# Patient Record
Sex: Female | Born: 1965 | Marital: Married
Health system: Southern US, Community
[De-identification: ages and names within clinical notes are randomized; demographics above are authoritative.]

---

## 2006-07-13 ENCOUNTER — Ambulatory Visit: Payer: Self-pay | Admitting: Obstetrics and Gynecology

## 2006-08-06 ENCOUNTER — Ambulatory Visit: Payer: Self-pay | Admitting: Unknown Physician Specialty

## 2007-10-05 ENCOUNTER — Ambulatory Visit: Payer: Self-pay | Admitting: Obstetrics and Gynecology

## 2009-03-11 ENCOUNTER — Ambulatory Visit: Payer: Self-pay | Admitting: Obstetrics and Gynecology

## 2009-03-14 ENCOUNTER — Ambulatory Visit: Payer: Self-pay | Admitting: Obstetrics and Gynecology

## 2009-09-17 ENCOUNTER — Ambulatory Visit: Payer: Self-pay | Admitting: Obstetrics and Gynecology

## 2010-03-18 ENCOUNTER — Ambulatory Visit: Payer: Self-pay | Admitting: Obstetrics and Gynecology

## 2011-06-23 ENCOUNTER — Ambulatory Visit: Payer: Self-pay | Admitting: Obstetrics and Gynecology

## 2012-06-30 ENCOUNTER — Ambulatory Visit: Payer: Self-pay | Admitting: Obstetrics and Gynecology

## 2013-06-27 ENCOUNTER — Ambulatory Visit: Payer: Self-pay | Admitting: Obstetrics and Gynecology

## 2015-04-21 IMAGING — US US BREAST*R* LIMITED INC AXILLA
1 series · 1 of 1 positions shown · non-contrast
Comparison: All prior studies dating back to 4118

CLINICAL DATA: Referring physician palpates a thickening in the 6
o'clock position of the right breast 2 cm from the nipple as
indicated by the patient

EXAM:
DIGITAL DIAGNOSTIC  BILATERAL MAMMOGRAM WITH CAD
ULTRASOUND RIGHT BREAST

[Series 1: us breast*right* limited inc axilla · 0.08mm/px · 1 of 1 slices shown]
[im 1/1]
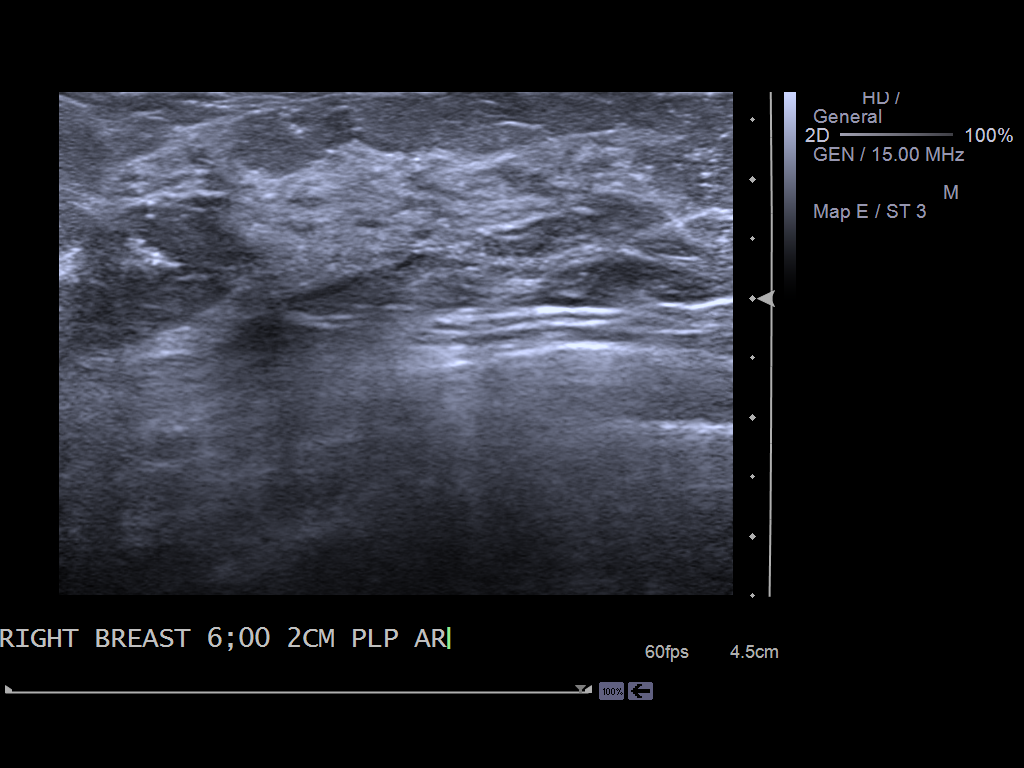

[1 of 1 positions shown; findings below may reference images not displayed]

ACR Breast Density Category c: The breast tissue is heterogeneously
dense, which may obscure small masses.
FINDINGS: Both breasts are unchanged and mammographically negative, including
on bilateral spot compression views. There is no evidence of mass or
distortion in the area of concern.

Mammographic images were processed with CAD.

On physical exam, there is palpable nodular breast tissue 2 cm from
the nipple in the 6 o'clock position of the right breast.

Ultrasound is performed, showing normal dense glandular tissue
corresponding to the area of palpable concern. There is no evidence
of mass.
IMPRESSION: No suspicious findings

RECOMMENDATION:
Screening bilateral mammogram in 1 year

I have discussed the findings and recommendations with the patient.
Results were also provided in writing at the conclusion of the
visit. If applicable, a reminder letter will be sent to the patient
regarding the next appointment.

BI-RADS CATEGORY  1: Negative.

## 2019-01-02 ENCOUNTER — Other Ambulatory Visit: Payer: Self-pay

## 2019-01-02 DIAGNOSIS — Z20822 Contact with and (suspected) exposure to covid-19: Secondary | ICD-10-CM

## 2019-01-03 LAB — NOVEL CORONAVIRUS, NAA: SARS-CoV-2, NAA: NOT DETECTED

## 2019-05-13 ENCOUNTER — Ambulatory Visit: Payer: Self-pay | Attending: Internal Medicine

## 2019-05-13 DIAGNOSIS — Z23 Encounter for immunization: Secondary | ICD-10-CM

## 2019-05-13 NOTE — Progress Notes (Signed)
   Covid-19 Vaccination Clinic  Name:  Nicole Hancock    MRN: [id number] DOB: Apr 30, 1965  05/13/2019  [name] was observed post Covid-19 immunization for 15 minutes without incident. She was provided with Vaccine Information Sheet and instruction to access the V-Safe system.   [name] was instructed to call 911 with any severe reactions post vaccine: Marland Kitchen Difficulty breathing  . Swelling of face and throat  . A fast heartbeat  . A bad rash all over body  . Dizziness and weakness   Immunizations Administered    Name Date Dose VIS Date Route   Pfizer COVID-19 Vaccine 05/13/2019 11:22 AM 0.3 mL 02/03/2019 Intramuscular   Manufacturer: [employer], [employer]   Lot: [id number]   NDC: [address]-9

## 2019-06-03 ENCOUNTER — Ambulatory Visit: Payer: Self-pay | Attending: Internal Medicine

## 2019-06-03 DIAGNOSIS — Z23 Encounter for immunization: Secondary | ICD-10-CM

## 2019-06-03 NOTE — Progress Notes (Signed)
   Covid-19 Vaccination Clinic  Name:  Nicole Hancock    MRN: [id number] DOB: 03/09/1965  06/03/2019  Nicole Hancock was observed post Covid-19 immunization for 15 minutes without incident. She was provided with Vaccine Information Sheet and instruction to access the V-Safe system.   Nicole Hancock was instructed to call 911 with any severe reactions post vaccine: Marland Kitchen Difficulty breathing  . Swelling of face and throat  . A fast heartbeat  . A bad rash all over body  . Dizziness and weakness   Immunizations Administered    Name Date Dose VIS Date Route   Pfizer COVID-19 Vaccine 06/03/2019 11:17 AM 0.3 mL 02/03/2019 Intramuscular   Manufacturer: [employer], [employer]   Lot: [id number]   NDC: [address]-7
